# Patient Record
Sex: Male | Born: 1975 | Race: Black or African American | Hispanic: No | Marital: Single
Health system: Southern US, Community
[De-identification: ages and names within clinical notes are randomized; demographics above are authoritative.]

## PROBLEM LIST (undated history)

## (undated) DIAGNOSIS — J45909 Unspecified asthma, uncomplicated: Secondary | ICD-10-CM

## (undated) DIAGNOSIS — I10 Essential (primary) hypertension: Secondary | ICD-10-CM

---

## 2017-09-24 ENCOUNTER — Encounter (HOSPITAL_COMMUNITY): Payer: Self-pay

## 2017-09-24 ENCOUNTER — Emergency Department (HOSPITAL_COMMUNITY): Payer: 59

## 2017-09-24 ENCOUNTER — Ambulatory Visit (HOSPITAL_COMMUNITY)
Admission: EM | Admit: 2017-09-24 | Discharge: 2017-09-24 | Disposition: A | Discharge status: Home / Self Care | Payer: 59 | Source: Home / Self Care

## 2017-09-24 ENCOUNTER — Emergency Department (HOSPITAL_COMMUNITY)
Admission: EM | Admit: 2017-09-24 | Discharge: 2017-09-24 | Disposition: A | Discharge status: Home / Self Care | Payer: 59 | Attending: Emergency Medicine | Admitting: Emergency Medicine

## 2017-09-24 ENCOUNTER — Other Ambulatory Visit: Payer: Self-pay

## 2017-09-24 DIAGNOSIS — Y929 Unspecified place or not applicable: Secondary | ICD-10-CM | POA: Diagnosis not present

## 2017-09-24 DIAGNOSIS — I10 Essential (primary) hypertension: Secondary | ICD-10-CM | POA: Insufficient documentation

## 2017-09-24 DIAGNOSIS — Y33XXXA Other specified events, undetermined intent, initial encounter: Secondary | ICD-10-CM | POA: Diagnosis not present

## 2017-09-24 DIAGNOSIS — S8991XA Unspecified injury of right lower leg, initial encounter: Secondary | ICD-10-CM | POA: Diagnosis present

## 2017-09-24 DIAGNOSIS — Y998 Other external cause status: Secondary | ICD-10-CM | POA: Insufficient documentation

## 2017-09-24 DIAGNOSIS — S86811A Strain of other muscle(s) and tendon(s) at lower leg level, right leg, initial encounter: Secondary | ICD-10-CM | POA: Insufficient documentation

## 2017-09-24 DIAGNOSIS — Y9367 Activity, basketball: Secondary | ICD-10-CM | POA: Insufficient documentation

## 2017-09-24 DIAGNOSIS — J45909 Unspecified asthma, uncomplicated: Secondary | ICD-10-CM | POA: Diagnosis not present

## 2017-09-24 HISTORY — DX: Unspecified asthma, uncomplicated: J45.909

## 2017-09-24 HISTORY — DX: Essential (primary) hypertension: I10

## 2017-09-24 MED ORDER — OXYCODONE-ACETAMINOPHEN 5-325 MG PO TABS: 2.0000 | ORAL_TABLET | ORAL | 0 refills | Status: AC | PRN

## 2017-09-24 MED ORDER — OXYCODONE-ACETAMINOPHEN 5-325 MG PO TABS
1.0000 | ORAL_TABLET | Freq: Once | ORAL | Status: AC
  Administered 2017-09-24: 1 via ORAL
  Filled 2017-09-24: qty 1

## 2017-09-24 MED ORDER — DICLOFENAC SODIUM 50 MG PO TBEC: 50.0000 mg | DELAYED_RELEASE_TABLET | Freq: Two times a day (BID) | ORAL | 0 refills | Status: AC

## 2017-09-24 NOTE — ED Provider Notes (Signed)
[facility]-EMERGENCY DEPT Provider Note   CSN: [id number] Arrival date & time: 09/24/17  2003     History   Chief Complaint Chief Complaint  Patient presents with  . Knee Pain    HPI Jeffery Ellison is a 42 y.o. male who presents to the ED with knee pain. The pain started just prior to arrival to the ED. Patient reports he was playing basketball when he felt a pop in his right knee and felt like it dislocated. Patient reports he is unable to bear weight on the right leg. Patient went to [facility] prior to coming here and they gave him an ice pack.  HPI  Past Medical History:  Diagnosis Date  . Asthma   . Hypertension     There are no active problems to display for this patient.   Home Medications    Prior to Admission medications   Medication Sig Start Date End Date Taking? Authorizing Provider  diclofenac (VOLTAREN) 50 MG EC tablet Take 1 tablet (50 mg total) by mouth 2 (two) times daily. 09/24/17   Janne Napoleon, NP  oxyCODONE-acetaminophen (PERCOCET/ROXICET) 5-325 MG tablet Take 2 tablets by mouth every 4 (four) hours as needed for severe pain. 09/24/17   Janne Napoleon, NP    Family History No family history on file.  Social History Social History   Tobacco Use  . Smoking status: Not on file  Substance Use Topics  . Alcohol use: Not on file  . Drug use: Not on file     Allergies   Patient has no allergy information on record.   Review of Systems Review of Systems  Musculoskeletal: Positive for arthralgias.       Right knee pain  All other systems reviewed and are negative.    Physical Exam Updated Vital Signs BP (!) 147/97 (BP Location: Left Arm)   Pulse 73   Temp 98.5 F (36.9 C) (Oral)   Resp 18   Ht 6' (1.829 m)   Wt 106.6 kg (235 lb)   SpO2 97%   BMI 31.87 kg/m   Physical Exam  Constitutional: He appears well-developed and well-nourished. No distress.  HENT:  Head: Normocephalic and atraumatic.  Eyes: EOM are  normal.  Neck: Neck supple.  Cardiovascular: Normal rate.  Pulmonary/Chest: Effort normal.  Musculoskeletal:       Right knee: He exhibits decreased range of motion, swelling, deformity, abnormal alignment and abnormal patellar mobility. He exhibits no erythema. Tenderness found. Patellar tendon tenderness noted.  High ridding patella, right. Patient unable to straighten leg or ambulate with right leg. Pedal pulse 2+.   Neurological: He is alert.  Skin: Skin is warm and dry.  Psychiatric: He has a normal mood and affect.  Nursing note and vitals reviewed.    ED Treatments / Results  Labs (all labs ordered are listed, but only abnormal results are displayed) Labs Reviewed - No data to display Radiology Dg Knee Complete 4 Views Right  Result Date: 09/24/2017 CLINICAL DATA:  Basketball injury, RIGHT knee dislocation EXAM: RIGHT KNEE - COMPLETE 4+ VIEW COMPARISON:  None. FINDINGS: There is patella Alta suggesting disruption of the patellar tendon. 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 Distal femur, proximal tibia and proximal fibula appear intact and normally aligned. IMPRESSION: 1. Patella Alta suggesting disruption and/or laxity of the patellar tendon. 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 2. No fracture line  or acute avulsion fracture seen. Electronically Signed   By: Bary RichardStan  Maynard M.D.   On: 09/24/2017 20:38    Procedures Procedures (including critical care time)  Medications Ordered in ED Medications  oxyCODONE-acetaminophen (PERCOCET/ROXICET) 5-325 MG per tablet 1 tablet (1 tablet Oral Given 09/24/17 2103)   Dr. Effie ShyWentz in to examine the patient and agrees with A/P.  Initial Impression / Assessment and Plan / ED Course  I have reviewed the triage vital signs and the nursing notes. 42 y.o. male here with right knee pain s/p  injury while playing basketball stable for d/c to f/u with ortho. Ace wrap, knee immobilizer, ice, elevation, pain management. No concern for compartment syndrome at this time.discussed in detail with the patient clinical and x-ray findings and need for immediate f/u and surgery with ortho. Patient agrees with plan.  Final Clinical Impressions(s) / ED Diagnoses   Final diagnoses:  Ruptured patellar tendon, right, initial encounter    ED Discharge Orders        Ordered    oxyCODONE-acetaminophen (PERCOCET/ROXICET) 5-325 MG tablet  Every 4 hours PRN     09/24/17 2055    diclofenac (VOLTAREN) 50 MG EC tablet  2 times daily     09/24/17 2055       Kerrie Buffaloeese, Kaidence Sant Fult[place], [place] 09/25/17 1837    [name], Elliott, MD 09/28/17 1530

## 2017-09-24 NOTE — Discharge Instructions (Signed)
Wear the ace wrap for comfort and apply ice over the ace wrap while sitting at home. Any time you get up you will need to apply the knee immobilizer to stabilize your knee. You can also sleepy in the knee immobilizer. Follow up with the orthopedic doctor as soon as possible. Do not do any activity that may cause injury while taking the narcotic pain medication as it will make you sleepy.

## 2017-09-24 NOTE — ED Triage Notes (Signed)
Per Dr Lum BabeEniola, pt should proceed to ED.

## 2017-09-24 NOTE — ED Triage Notes (Signed)
Pt states she was playing basketball and as he was getting a ball, felt RT knee "dislocate"  [facility] arrival here.  Pt states he is unable to bear weight to the RT leg.  He is able to move the RT foot w/o difficulty.

## 2017-09-24 NOTE — ED Provider Notes (Signed)
   Face-to-face evaluation   History: Patient was scrambling for a ball, and a basketball game, when he felt his right knee "dislocate."  He was unable to stand or walk afterwards.  He presents by private vehicle for evaluation.  Physical exam: Right knee swollen anteriorly with palpable patella alta.  Mild tenderness and softness (boggy) of the tissue just below the patella.  Patient is unable to extend the right lower leg secondary to disrupted extensor mechanism.  Medical screening examination/treatment/procedure(s) were conducted as a shared visit with non-physician practitioner(s) and myself.  I personally evaluated the patient during the encounter     [name], Jeffery Galentine, MD 09/28/17 1530

## 2019-03-12 IMAGING — CR DG KNEE COMPLETE 4+V*R*
4 series · 4 of 4 positions shown · non-contrast
Comparison: None.

CLINICAL DATA: Basketball injury, RIGHT knee dislocation

EXAM:
RIGHT KNEE - COMPLETE 4+ VIEW

[t knee ap right]
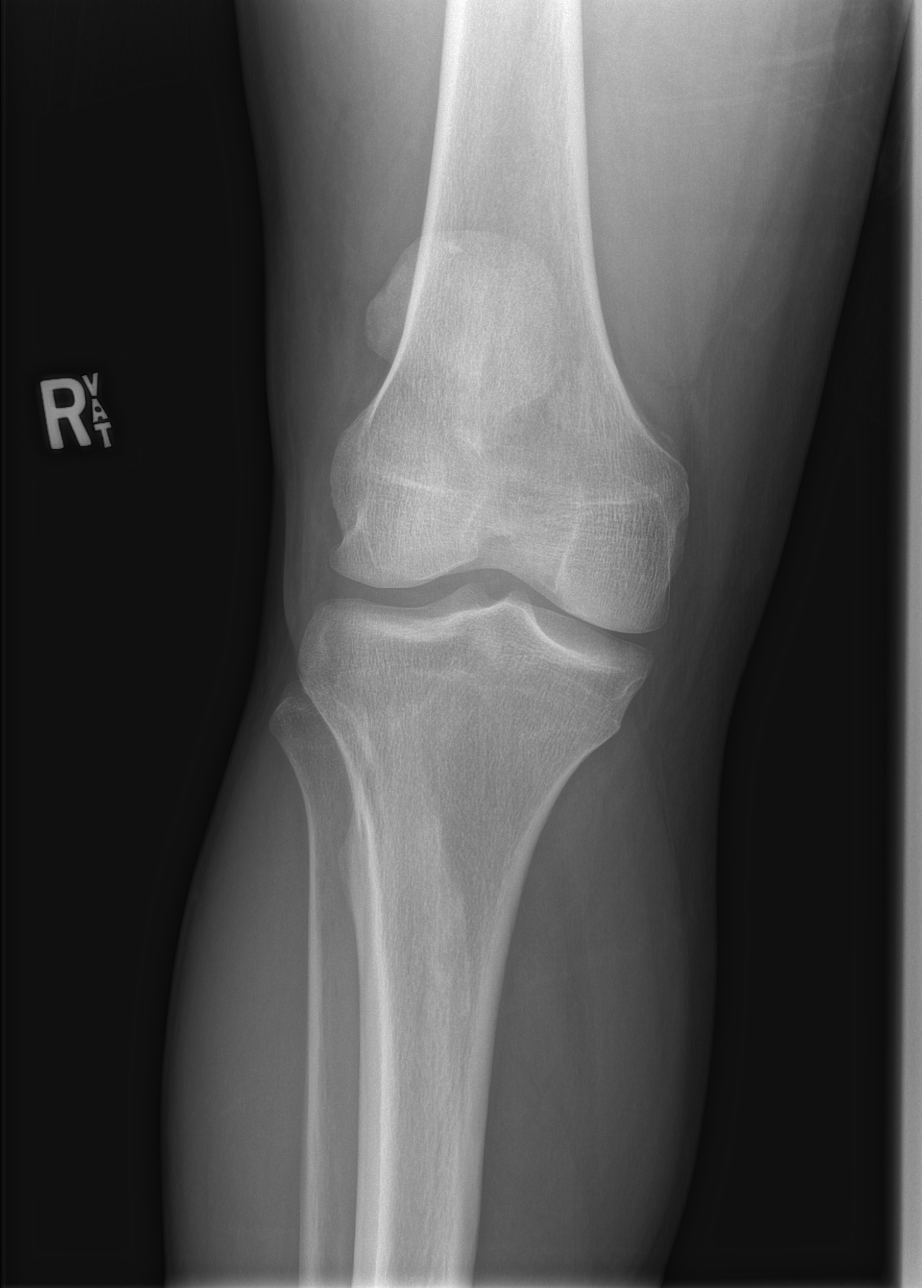

[t knee obl right (1 of 2)]
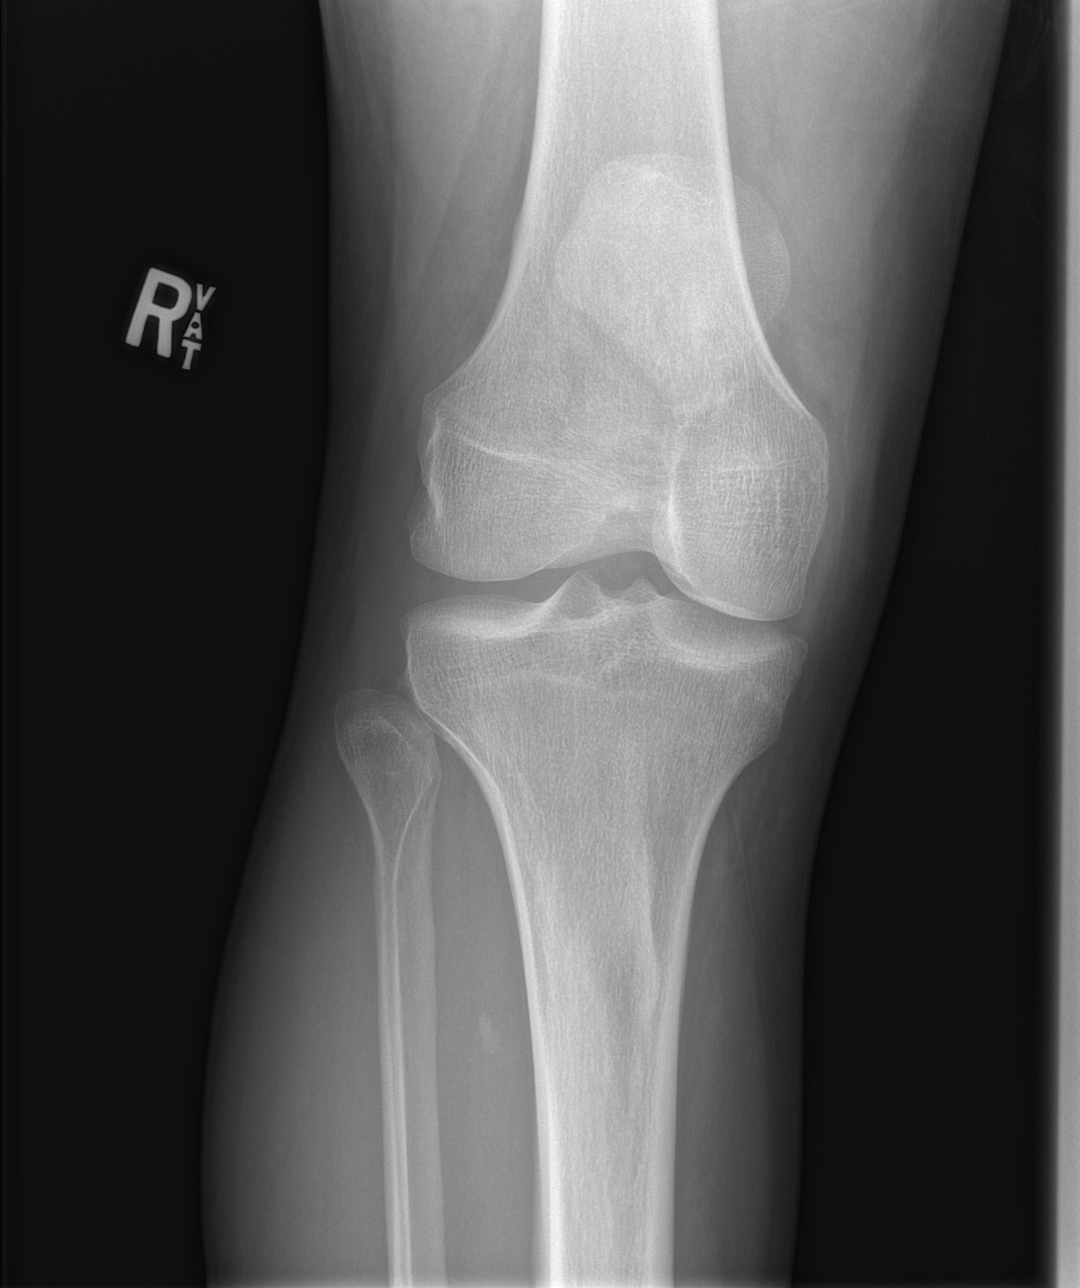

[t knee obl right (2 of 2)]
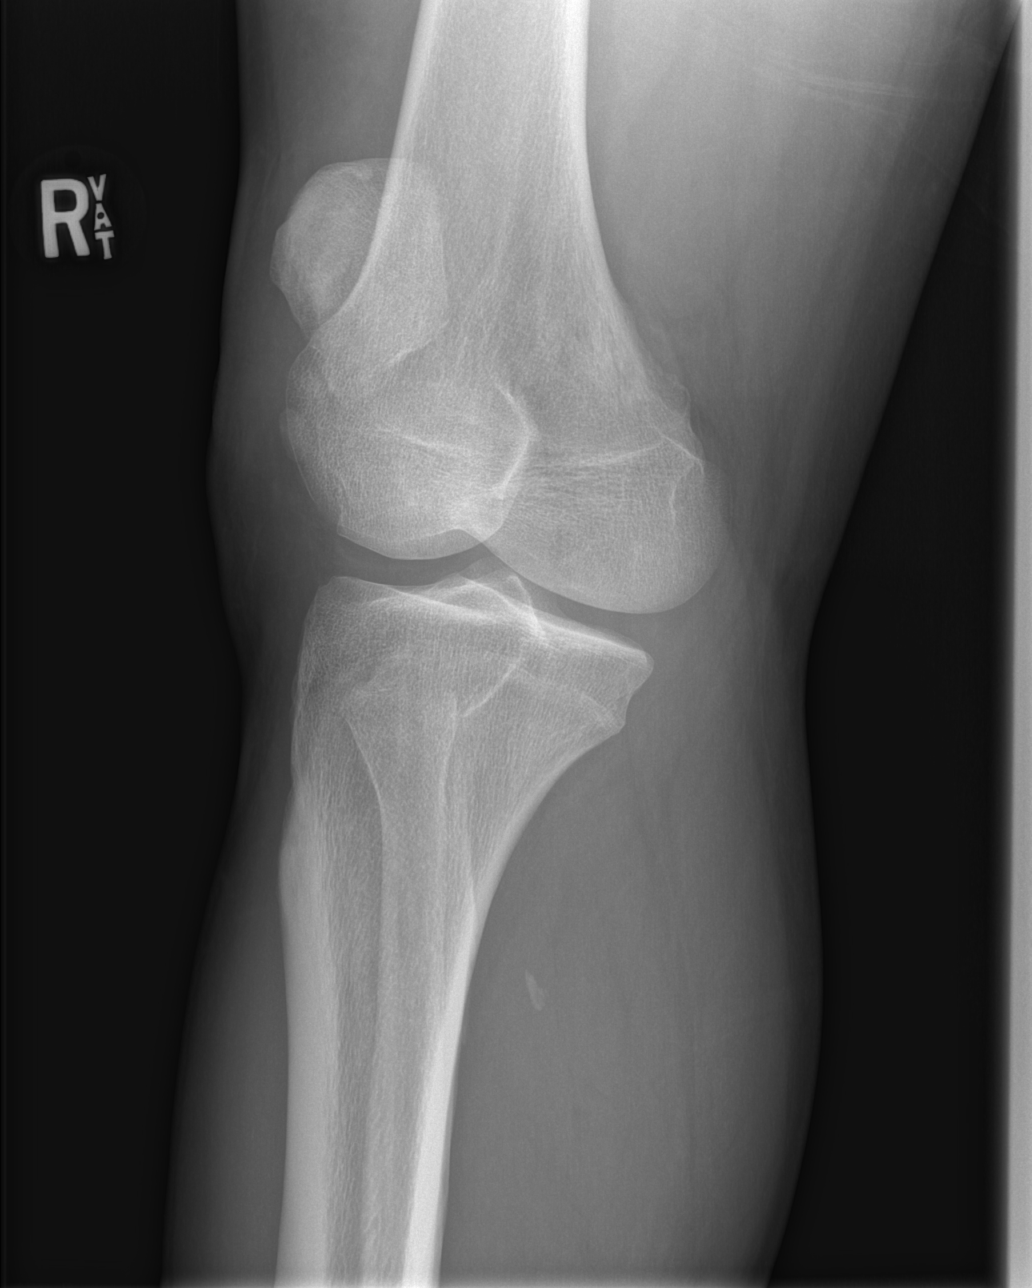

[t knee lat right]
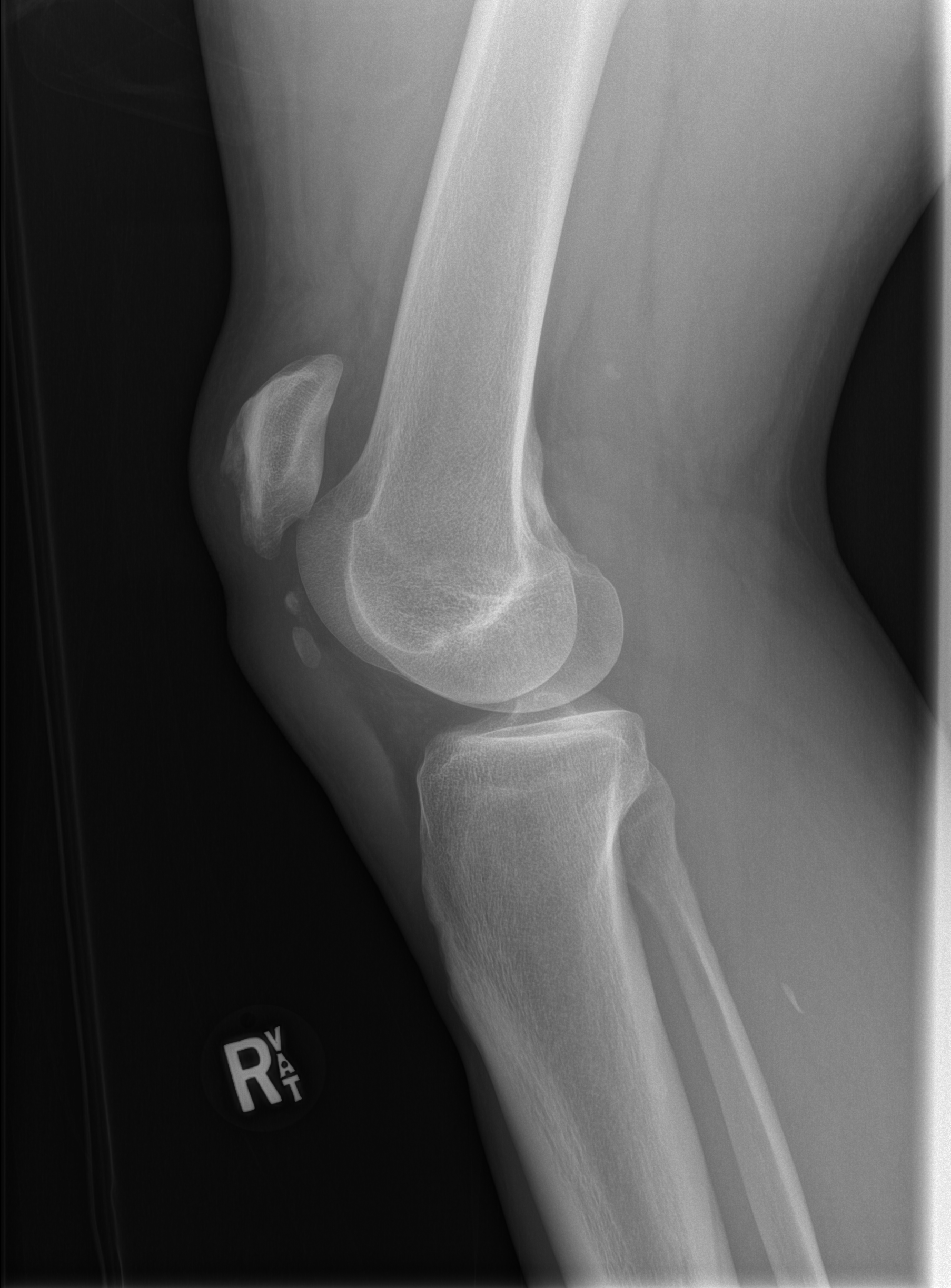

[4 of 4 positions shown; findings below may reference images not displayed]

FINDINGS: There is patella Alta suggesting disruption of the patellar tendon.
Chronic appearing calcifications underlying the patella suggests
associated chronic tendinosis of the patellar tendon.

Distal femur, proximal tibia and proximal fibula appear intact and
normally aligned.
IMPRESSION: 1. Patella Alta suggesting disruption and/or laxity of the patellar
tendon. Chronic appearing calcifications underlying the patella,
compatible with sequela of associated chronic tendinosis.
2. No fracture line or acute avulsion fracture seen.
